# Patient Record
Sex: Male | Born: 2000 | Hispanic: Refuse to answer | Marital: Single | Smoking: Never smoker
Health system: Southern US, Community
[De-identification: ages and names within clinical notes are randomized; demographics above are authoritative.]

## PROBLEM LIST (undated history)

## (undated) DIAGNOSIS — G43909 Migraine, unspecified, not intractable, without status migrainosus: Secondary | ICD-10-CM

## (undated) HISTORY — DX: Migraine, unspecified, not intractable, without status migrainosus: G43.909

---
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
## 2019-01-12 ENCOUNTER — Other Ambulatory Visit: Payer: Self-pay

## 2019-01-12 ENCOUNTER — Telehealth (INDEPENDENT_AMBULATORY_CARE_PROVIDER_SITE_OTHER): Payer: PRIVATE HEALTH INSURANCE | Admitting: Family Medicine

## 2019-01-12 DIAGNOSIS — D573 Sickle-cell trait: Secondary | ICD-10-CM | POA: Diagnosis not present

## 2019-01-12 DIAGNOSIS — Z8619 Personal history of other infectious and parasitic diseases: Secondary | ICD-10-CM | POA: Diagnosis not present

## 2019-01-12 DIAGNOSIS — Z8616 Personal history of COVID-19: Secondary | ICD-10-CM

## 2019-01-13 NOTE — Progress Notes (Signed)
Patient denies any symptoms over the phone. Has returned to school after isolation. Presents for ECG after having COVID-19 diagnosed early in the month.  ECG compared to previous. No significant changes. Will have Cardiology review and compare with previous.  Advance activity slowly given hx of sickle cell trait and recent COVID-19. Discussed this with athletic trainer. Athletic trainer to discuss with Water engineer. Stay hydrated and inform training staff if any problems during physical activity.

## 2019-02-07 ENCOUNTER — Other Ambulatory Visit: Payer: Self-pay | Admitting: Family Medicine

## 2019-02-07 DIAGNOSIS — Z8616 Personal history of COVID-19: Secondary | ICD-10-CM

## 2019-02-07 DIAGNOSIS — Z8619 Personal history of other infectious and parasitic diseases: Secondary | ICD-10-CM

## 2019-02-09 ENCOUNTER — Other Ambulatory Visit: Payer: PRIVATE HEALTH INSURANCE

## 2019-02-12 ENCOUNTER — Ambulatory Visit (INDEPENDENT_AMBULATORY_CARE_PROVIDER_SITE_OTHER): Payer: No Typology Code available for payment source

## 2019-02-12 ENCOUNTER — Other Ambulatory Visit: Payer: Self-pay

## 2019-02-12 DIAGNOSIS — Z8616 Personal history of COVID-19: Secondary | ICD-10-CM

## 2019-02-12 DIAGNOSIS — Z8619 Personal history of other infectious and parasitic diseases: Secondary | ICD-10-CM | POA: Diagnosis not present

## 2019-06-01 IMAGING — CR DG SC JOINTS 3+V
1 series · 5 of 5 positions shown · non-contrast
Comparison: None available.

CLINICAL DATA: Initial evaluation for acute pain at right
sternoclavicular joint status post recent injury.

EXAM:
STERNOCLAVICULAR [id number]+ VIEW

[Series 1: dg sc joints · 0.14mm/px · 5 of 5 slices shown]
[im 1/5]
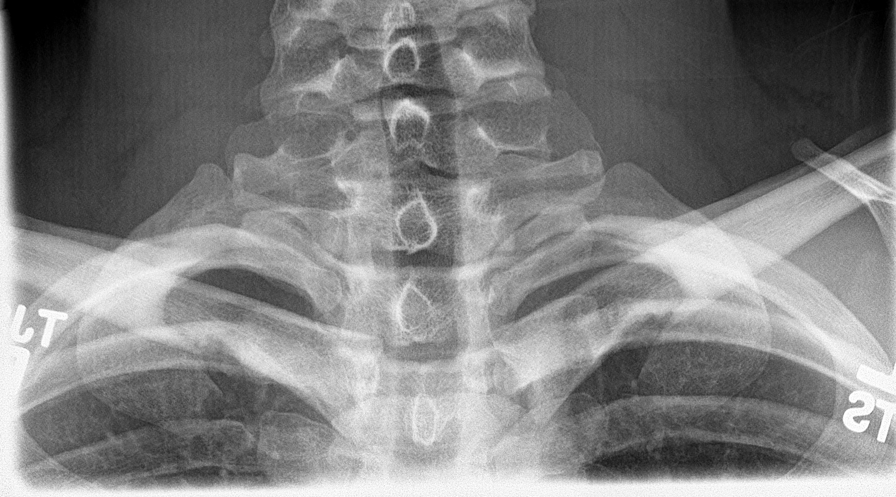
[im 2/5]
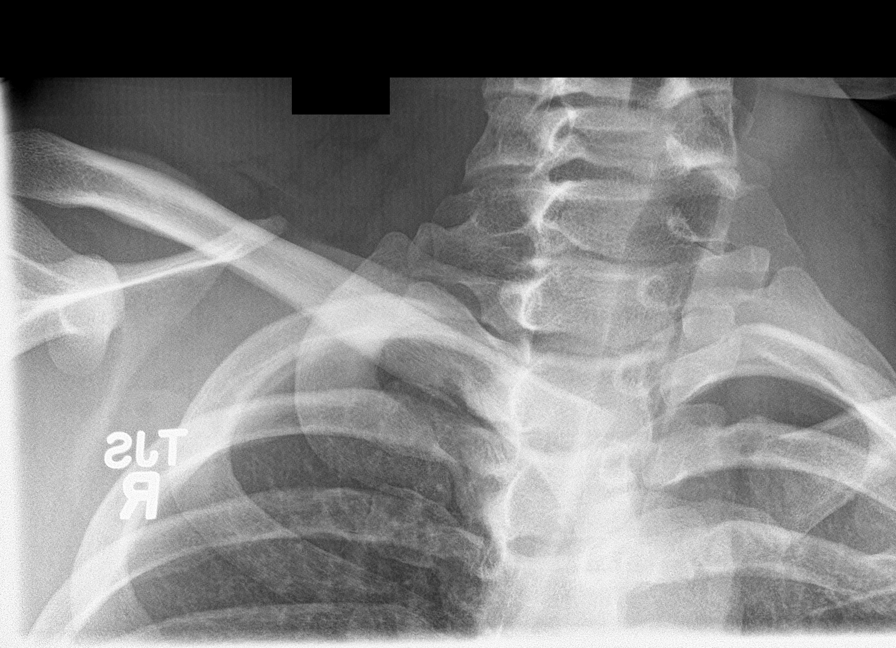
[im 3/5]
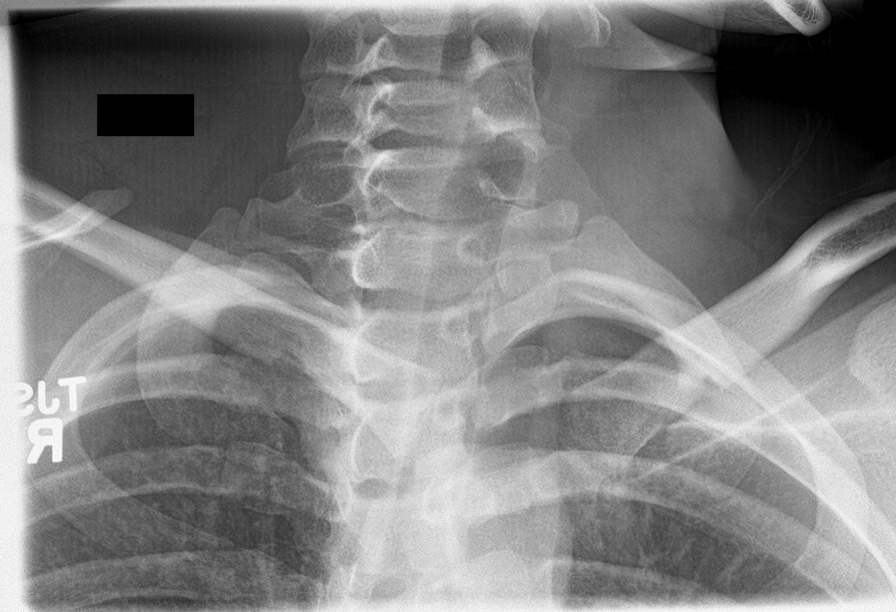
[im 4/5]
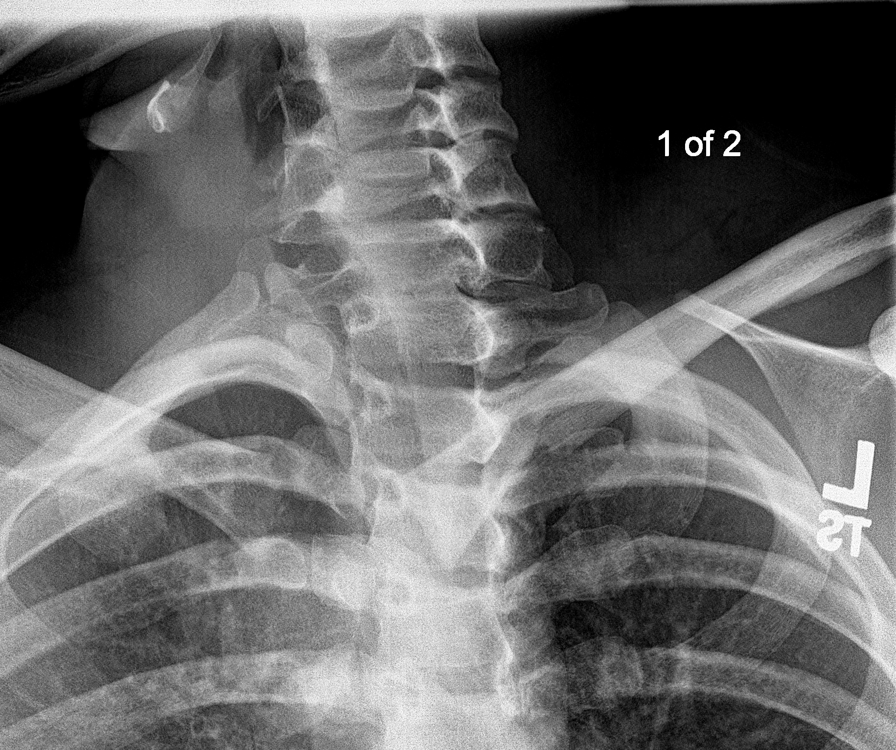
[im 5/5]
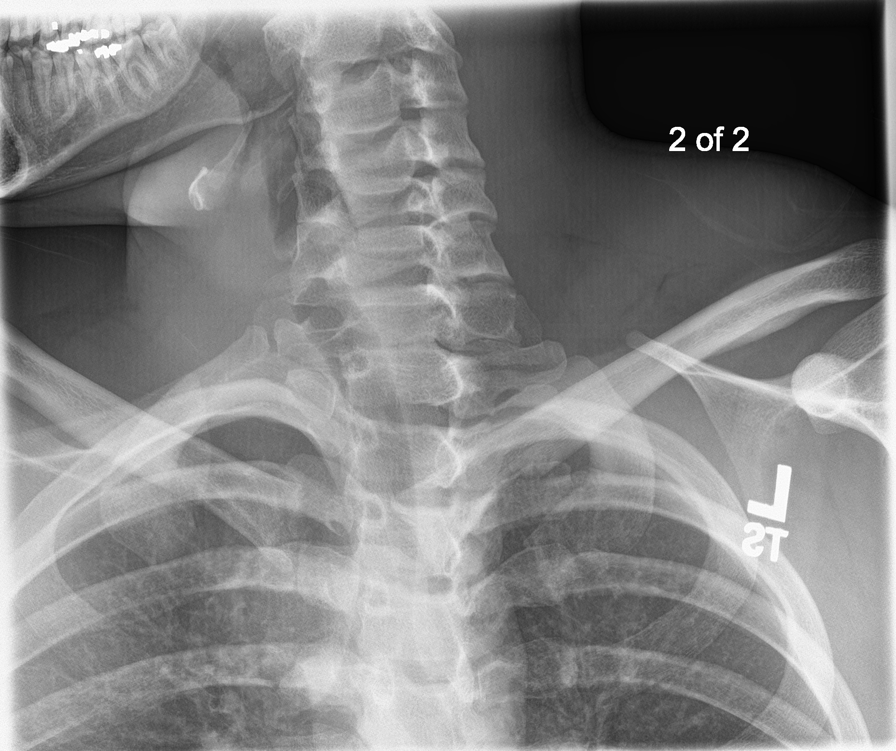

[5 of 5 positions shown; findings below may reference images not displayed]

FINDINGS: No acute displaced fracture identified about either clavicle or
sternoclavicular joint. Sternoclavicular joint some cells appear in
gross anatomic alignment. No focal osseous lesions. Visualized ribs
intact. Visualized upper lungs are clear. No soft tissue
abnormality.
IMPRESSION: No acute osseous abnormality identified about either
sternoclavicular joint.

## 2019-09-25 ENCOUNTER — Ambulatory Visit: Payer: No Typology Code available for payment source | Admitting: Family

## 2019-09-25 ENCOUNTER — Other Ambulatory Visit: Payer: Self-pay

## 2019-09-25 DIAGNOSIS — L089 Local infection of the skin and subcutaneous tissue, unspecified: Secondary | ICD-10-CM

## 2019-09-25 MED ORDER — SULFAMETHOXAZOLE-TRIMETHOPRIM 800-160 MG PO TABS: 1.0000 | ORAL_TABLET | Freq: Two times a day (BID) | ORAL | 0 refills | Status: DC

## 2019-09-25 NOTE — Progress Notes (Signed)
   Acute Office Visit  Subjective:    Patient ID: Hector Howard, male    DOB: Jul 07, 2000, 19 y.o.   MRN: [id number]  Chief Complaint  Patient presents with  . Wound Infection    HPI Patient is in today with c/o right toe infection.  He has a family history of ingrown toenails.  This pain started 1 week ago, worsening.  No otc tx.    No past medical history on file.   No family history on file.   No Known Allergies  Review of Systems  Constitutional: Positive for activity change.  Skin: Positive for wound.       Objective:    Physical Exam Constitutional:      Appearance: Normal appearance. He is normal weight.  Skin:    General: Skin is warm and dry.     Findings: Erythema present.     Comments: Redness and swelling to right medial great toe.  Tender to palpation.  No active drainage.  Neurological:     Mental Status: He is alert.     There were no vitals taken for this visit.     Assessment & Plan:   Problem List Items Addressed This Visit    None    Visit Diagnoses    Skin infection    -  Primary   Relevant Medications   sulfamethoxazole-trimethoprim (BACTRIM DS) 800-160 MG tablet     Reviewed exam and HPI.  Start Bactrim DS (sent to [facility]).  Keep area clean and dry.  Rtc if no improvement in 1 week or if symptoms return after treatment.   Meds ordered this encounter  Medications  . sulfamethoxazole-trimethoprim (BACTRIM DS) 800-160 MG tablet    Sig: Take 1 tablet by mouth 2 (two) times daily.    Dispense:  20 tablet    Refill:  0    Order Specific Question:   Supervising Provider    Answer:   Noralee Stain [[id number]]     Abbygayle Helfand, Deirdre Peer, NP

## 2019-10-01 ENCOUNTER — Other Ambulatory Visit: Payer: Self-pay

## 2019-10-01 ENCOUNTER — Ambulatory Visit: Payer: Medicaid Other | Admitting: Family

## 2019-10-01 DIAGNOSIS — Z113 Encounter for screening for infections with a predominantly sexual mode of transmission: Secondary | ICD-10-CM

## 2019-10-01 NOTE — Progress Notes (Signed)
   Acute Office Visit  Subjective:    Patient ID: Hector Howard, male    DOB: 2001-06-11, 19 y.o.   MRN: [id number]  Chief Complaint  Patient presents with  . Labs Only    STI testing    HPI Patient is in today for STI testing.  No history of STI testing. Pt denies symptoms or concerns.  Pt sexually active with females.  Does not use condoms at every encounter.  No past medical history on file.  No Known Allergies     Objective:    Physical Exam Constitutional:      Appearance: Normal appearance.     There were no vitals taken for this visit. Wt Readings from Last 3 Encounters:  No data found for Wt      Assessment & Plan:   Problem List Items Addressed This Visit    None    Visit Diagnoses    Screening examination for venereal disease    -  Primary     Reviewed CDC guidelines for STI screen. Will obtain GC, Trich, HIV and RPR.  Results will be emailed to pt. Practice safe sex.  No orders of the defined types were placed in this encounter.    Nimrat Woolworth, Deirdre Peer, NP

## 2019-10-02 ENCOUNTER — Other Ambulatory Visit: Payer: Self-pay | Admitting: Family

## 2019-10-05 LAB — HIV ANTIBODY (ROUTINE TESTING W REFLEX): HIV Screen 4th Generation wRfx: NONREACTIVE

## 2019-10-05 LAB — CHLAMYDIA/GONOCOCCUS/TRICHOMONAS, NAA
Chlamydia by NAA: POSITIVE — AB
Gonococcus by NAA: NEGATIVE
Trich vag by NAA: NEGATIVE

## 2019-10-05 LAB — RPR: RPR Ser Ql: NONREACTIVE

## 2019-10-06 ENCOUNTER — Telehealth: Payer: Self-pay | Admitting: Family

## 2019-10-06 DIAGNOSIS — A749 Chlamydial infection, unspecified: Secondary | ICD-10-CM

## 2019-10-06 MED ORDER — AZITHROMYCIN 500 MG PO TABS: 1000.0000 mg | ORAL_TABLET | Freq: Once | ORAL | 0 refills | Status: AC

## 2019-10-06 NOTE — Telephone Encounter (Signed)
Contacted pt with positive Chlamydia results.  Reviewed tx plan and to abstain from sex for 2 weeks, safe sex practices advised thereafter.  Follow up in 3 months for repeat testing.  Pt agrees with plan.  Azithromycin sent to [facility] in [place].  [facility] form sent.

## 2019-10-10 ENCOUNTER — Other Ambulatory Visit: Payer: Self-pay

## 2019-10-11 ENCOUNTER — Telehealth: Payer: Self-pay | Admitting: Family

## 2019-10-11 LAB — 5+CRT-BUND
Amphetamines, Urine: NEGATIVE ng/mL
Cannabinoid Quant, Ur: NEGATIVE ng/mL
Cocaine (Metab.): NEGATIVE ng/mL
Creatinine, Urine: 153.2 mg/dL (ref 20.0–300.0)
OPIATE QUANTITATIVE URINE: NEGATIVE ng/mL
PCP Quant, Ur: NEGATIVE ng/mL
pH, Urine: 6 (ref 4.5–8.9)

## 2019-10-11 NOTE — Telephone Encounter (Signed)
Contacted by the Athletic Department for a urine drug screen on this pt.  Order written 10/09/19, UDS obtained 10/10/19, resulted 10/11/19.  See scanned results.

## 2022-09-24 ENCOUNTER — Emergency Department
Admission: EM | Admit: 2022-09-24 | Discharge: 2022-09-25 | Disposition: A | Discharge status: Home / Self Care | Payer: Medicaid Other | Attending: Emergency Medicine | Admitting: Emergency Medicine

## 2022-09-24 ENCOUNTER — Other Ambulatory Visit: Payer: Self-pay

## 2022-09-24 DIAGNOSIS — M6282 Rhabdomyolysis: Secondary | ICD-10-CM | POA: Diagnosis not present

## 2022-09-24 DIAGNOSIS — R519 Headache, unspecified: Secondary | ICD-10-CM | POA: Diagnosis not present

## 2022-09-24 LAB — COMPREHENSIVE METABOLIC PANEL
ALT: 48 U/L — ABNORMAL HIGH (ref 0–44)
AST: 86 U/L — ABNORMAL HIGH (ref 15–41)
Albumin: 4.7 g/dL (ref 3.5–5.0)
Alkaline Phosphatase: 68 U/L (ref 38–126)
Anion gap: 12 (ref 5–15)
BUN: 12 mg/dL (ref 6–20)
CO2: 25 mmol/L (ref 22–32)
Calcium: 9.6 mg/dL (ref 8.9–10.3)
Chloride: 99 mmol/L (ref 98–111)
Creatinine, Ser: 1.23 mg/dL (ref 0.61–1.24)
GFR, Estimated: >60 mL/min (ref >60)
Glucose, Bld: 99 mg/dL (ref 70–99)
Potassium: 4 mmol/L (ref 3.5–5.1)
Sodium: 136 mmol/L (ref 135–145)
Total Bilirubin: 0.8 mg/dL (ref 0.3–1.2)
Total Protein: 7.7 g/dL (ref 6.5–8.1)

## 2022-09-24 LAB — CBC
HCT: 44.4 % (ref 39.0–52.0)
Hemoglobin: 15.3 g/dL (ref 13.0–17.0)
MCH: 27.6 pg (ref 26.0–34.0)
MCHC: 34.5 g/dL (ref 30.0–36.0)
MCV: 80 fL (ref 80.0–100.0)
Platelets: 279 10*3/uL (ref 150–400)
RBC: 5.55 MIL/uL (ref 4.22–5.81)
RDW: 12.3 % (ref 11.5–15.5)
WBC: 8.6 10*3/uL (ref 4.0–10.5)
nRBC: 0 % (ref 0.0–0.2)

## 2022-09-24 LAB — LIPASE, BLOOD: Lipase: 27 U/L (ref 11–51)

## 2022-09-24 NOTE — ED Triage Notes (Signed)
Pt to ED via POV c/o vomiting, blurry vision, and headache. Pt states he has been vomiting for the past 3 hrs. Pt also says he's having blurry vision and headache, which he usually gets with migraines but this feels different. Pt denies CP, SOB, fevers, dizziness

## 2022-09-25 ENCOUNTER — Emergency Department: Payer: Medicaid Other

## 2022-09-25 LAB — CK: Total CK: 3690 U/L — ABNORMAL HIGH (ref 49–397)

## 2022-09-25 MED ORDER — BUTALBITAL-APAP-CAFFEINE 50-325-40 MG PO TABS: 1.0000 | ORAL_TABLET | ORAL | 0 refills | Status: AC | PRN

## 2022-09-25 MED ORDER — DEXAMETHASONE SODIUM PHOSPHATE 10 MG/ML IJ SOLN
10.0000 mg | Freq: Once | INTRAMUSCULAR | Status: AC
  Administered 2022-09-25: 10 mg via INTRAVENOUS
  Filled 2022-09-25: qty 1

## 2022-09-25 MED ORDER — METOCLOPRAMIDE HCL 5 MG/ML IJ SOLN
5.0000 mg | Freq: Once | INTRAMUSCULAR | Status: AC
  Administered 2022-09-25: 5 mg via INTRAVENOUS
  Filled 2022-09-25: qty 2

## 2022-09-25 MED ORDER — SODIUM CHLORIDE 0.9 % IV BOLUS
1000.0000 mL | Freq: Once | INTRAVENOUS | Status: AC
  Administered 2022-09-25: 1000 mL via INTRAVENOUS

## 2022-09-25 MED ORDER — IOHEXOL 350 MG/ML SOLN
75.0000 mL | Freq: Once | INTRAVENOUS | Status: AC | PRN
  Administered 2022-09-25: 75 mL via INTRAVENOUS

## 2022-09-25 NOTE — ED Provider Notes (Signed)
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  Patient states he has never been formally diagnosed with migraine headaches but has left-sided headaches approximately twice a year.  Last headache was 7 months ago after a football game.  Tonight he was watching football when he noted left-sided headache which she describes as tightness approximately 6 hours ago.  Symptoms associated with blurry vision, nausea and vomiting.  Similar symptoms previously but states tonight is the longest and most intense that his headache has lasted.  No relief with ibuprofen, caffeine and fluids given by his trainer.  Denies recent fever/chills, cough, chest pain, shortness of breath, abdominal pain, diarrhea or dizziness.  Denies recent trauma.     Past Medical History  History reviewed. No pertinent past medical history.   Active Problem List  There are no problems to display for this patient.    Past Surgical History   Past Surgical History:  Procedure Laterality Date   KIDNEY SURGERY Right 2016     Home Medications   Prior to Admission medications   Medication Sig Start Date End Date Taking? Authorizing Provider  butalbital-acetaminophen-caffeine (FIORICET) 50-325-40 MG tablet Take 1 tablet by mouth every 4 (four) hours as needed for headache. 09/25/22  Yes Irean Hong, MD  sulfamethoxazole-trimethoprim (BACTRIM DS) 800-160 MG tablet Take 1 tablet by mouth 2 (two) times daily. 09/25/19   Ronne Binning, NP     Allergies  Patient has no known allergies.   Family History  History reviewed. No pertinent family history.   Physical Exam  Triage Vital Signs: ED Triage Vitals  Enc Vitals Group     BP 09/24/22 2131  139/71     Pulse Rate 09/24/22 2131 67     Resp 09/24/22 2131 16     Temp 09/24/22 2131 97.7 F (36.5 C)     Temp Source 09/24/22 2131 Oral     SpO2 09/24/22 2131 100 %     Weight 09/24/22 2129 200 lb (90.7 kg)     Height 09/24/22 2129 6\' 1"  (1.854 m)     Head Circumference --      Peak Flow --      Pain Score 09/24/22 2129 3     Pain Loc --      Pain Edu? --      Excl. in GC? --     Updated Vital Signs: BP 119/77   Pulse (!) 59   Temp 97.7 F (36.5 C) (Oral)   Resp 16   Ht 6\' 1"  (1.854 m)   Wt 90.7 kg   SpO2 99%   BMI 26.39 kg/m    General: Awake, mild distress.  CV:  RRR.  Good peripheral perfusion.  Resp:  Normal effort.  CTAB. Abd:  Nontender.  No distention.  Other:  Alert and oriented x 3.  PERRL.  EOMI.  No carotid bruits.  Supple neck without meningismus.  CN II-XII grossly intact.  5/5 motor strength and sensation all extremities.   MAE x 4.  No petechiae.   ED Results / Procedures / Treatments  Labs (all labs ordered are listed, but only abnormal results are displayed) Labs Reviewed  COMPREHENSIVE METABOLIC PANEL - Abnormal; Notable for the following components:      Result Value   AST 86 (*)    ALT 48 (*)    All other components within normal limits  CK - Abnormal; Notable for the following components:   Total CK 3,690 (*)    All other components within normal limits  LIPASE, BLOOD  CBC  URINALYSIS, ROUTINE W REFLEX MICROSCOPIC     EKG  None   RADIOLOGY I have independently visualized and interpreted patient's CTA head as well as noted the radiology interpretation:  CTA head: Unremarkable; no acute intracranial abnormality, no intracranial large vessel occlusion or significant stenosis.  No evidence of aneurysm or vascular malformation.  Official radiology report(s): CT Angio Head W or Wo Contrast  Result Date: 09/25/2022 CLINICAL DATA:  Left headache, worse than usual, vomiting, blurry vision EXAM: CT ANGIOGRAPHY HEAD TECHNIQUE:  Multidetector CT imaging of the head was performed using the standard protocol during bolus administration of intravenous contrast. Multiplanar CT image reconstructions and MIPs were obtained to evaluate the vascular anatomy. RADIATION DOSE REDUCTION: This exam was performed according to the departmental dose-optimization program which includes automated exposure control, adjustment of the mA and/or kV according to patient size and/or use of iterative reconstruction technique. CONTRAST:  35mL OMNIPAQUE IOHEXOL 350 MG/ML SOLN COMPARISON:  None Available. FINDINGS: CT HEAD FINDINGS Brain: No evidence of acute infarct, hemorrhage, mass, mass effect, or midline shift. No hydrocephalus or extra-axial fluid collection. Vascular: No hyperdense vessel. Skull: Negative for fracture or focal lesion. Sinuses/Orbits: No acute finding. Other: The mastoid air cells are well aerated. CTA HEAD FINDINGS Anterior circulation: Both internal carotid arteries are patent to the termini, without significant stenosis. A1 segments patent. Normal anterior communicating artery. Anterior cerebral arteries are patent to their distal aspects without significant stenosis. No M1 stenosis or occlusion. MCA branches perfused to their distal aspects without significant stenosis. Posterior circulation: Vertebral arteries patent to the vertebrobasilar junction without significant stenosis. Posterior inferior cerebellar artery patent on the left, not seen on the right. Basilar patent to its distal aspect without significant stenosis. Superior cerebellar arteries patent proximally. Patent P1 segments. PCAs perfused to their distal aspects without significant stenosis. The bilateral posterior communicating arteries are patent. Venous sinuses: Patent. Anatomic variants: None significant. Review of the MIP images confirms the above findings No evidence of aneurysm or vascular malformation. IMPRESSION: 1. No acute intracranial abnormality. 2. No intracranial  large vessel occlusion or significant stenosis. No evidence of aneurysm or vascular malformation. Electronically Signed   By: Wiliam Ke M.D.   On: 09/25/2022 01:02     PROCEDURES:  Critical Care performed: No  Procedures   MEDICATIONS ORDERED IN ED: Medications  sodium chloride 0.9 % bolus 1,000 mL (0 mLs Intravenous Stopped 09/25/22 0348)  metoCLOPramide (REGLAN) injection 5 mg (5 mg Intravenous Given 09/25/22 0024)  dexamethasone (DECADRON) injection 10 mg (10 mg Intravenous Given 09/25/22 0026)  iohexol (OMNIPAQUE) 350 MG/ML injection 75 mL (75 mLs Intravenous Contrast Given 09/25/22 0041)  sodium chloride 0.9 % bolus 1,000 mL (0 mLs Intravenous Stopped 09/25/22 0348)     IMPRESSION / MDM / ASSESSMENT AND PLAN / ED COURSE  I reviewed the triage vital signs and the nursing notes.                             22 year old male presenting with headache. Differential diagnosis includes, but is not limited to, intracranial hemorrhage,  meningitis/encephalitis, previous head trauma, cavernous venous thrombosis, tension headache, temporal arteritis, migraine or migraine equivalent, idiopathic intracranial hypertension, and non-specific headache.  I have personally reviewed patient's records and note an urgent care visit on 04/05/2022 for vaccination.  Patient's presentation is most consistent with acute presentation with potential threat to life or bodily function.  Laboratory results demonstrate normal WBC 8.6, unremarkable electrolytes.  Neck is supple and patient is neurologically intact without deficits.  However, given complaints this is the longest lasting headache he has ever had, will obtain CTA head with and without contrast.  Initiate IV fluid hydration, IV Reglan and Decadron.  Will reassess.  Clinical Course as of 09/25/22 0542  Sat Sep 25, 2022  0216 Updated patient and athletic trainer of negative CTA head.  CK is 3690.  Will discharge home after infusion of 2 L IV fluids.  Will  refer to neurology for outpatient follow-up and discharged home on as needed Fioricet.  Strict return precautions given.  Patient verbalizes understanding and agrees with plan of care. [JS]    Clinical Course User Index [JS] Irean HongSung, Jacqulyn Barresi J, MD     FINAL CLINICAL IMPRESSION(S) / ED DIAGNOSES   Final diagnoses:  Bad headache  Non-traumatic rhabdomyolysis     Rx / DC Orders   ED Discharge Orders          Ordered    butalbital-acetaminophen-caffeine (FIORICET) 50-325-40 MG tablet  Every 4 hours PRN        09/25/22 0218             Note:  This document was prepared using Dragon voice recognition software and may include unintentional dictation errors.   Irean HongSung, Jalonda Antigua J, MD 09/25/22 [phone]

## 2022-09-25 NOTE — Discharge Instructions (Signed)
You may take Fioricet as needed for headache.  Drink plenty of fluids daily.  Return to the ER for worsening symptoms, persistent vomiting, difficulty breathing or other concerns.

## 2022-10-19 ENCOUNTER — Other Ambulatory Visit: Payer: Self-pay

## 2022-10-19 ENCOUNTER — Ambulatory Visit (INDEPENDENT_AMBULATORY_CARE_PROVIDER_SITE_OTHER): Payer: Medicaid Other | Admitting: Medical

## 2022-10-19 ENCOUNTER — Encounter: Payer: Self-pay | Admitting: Medical

## 2022-10-19 VITALS — BP 126/81 | HR 78 | Temp 97.9°F | Wt 199.0 lb

## 2022-10-19 DIAGNOSIS — J22 Unspecified acute lower respiratory infection: Secondary | ICD-10-CM

## 2022-10-19 MED ORDER — AMOXICILLIN-POT CLAVULANATE 875-125 MG PO TABS
1.0000 | ORAL_TABLET | Freq: Two times a day (BID) | ORAL | 0 refills | Status: DC
Start: 2022-10-19 — End: 2022-12-08

## 2022-10-19 NOTE — Patient Instructions (Signed)
-  Take complete course of antibiotics as prescribed.  Take with food.   -Rest and stay well hydrated (by drinking water and other liquids).  -Take over-the-counter medicines (i.e. Mucinex DM, Sudafed) to help relieve your symptoms. -For your cough, use cough drops/throat lozenges, gargle warm salt water and/or drink warm liquids (like tea with honey). -Send MyChart message to provider or schedule return visit as needed for new/worsening symptoms (i.e. fever, shortness of breath) or if symptoms do not improve as discussed with recommended treatment over next 3-5 days.

## 2022-10-19 NOTE — Progress Notes (Signed)
[facility] [address] [place], [place] [address] Phone: [phone] Fax: [phone]   Office Visit Note  Patient Name: Hector Howard  Date of Birth:[birth year]  Med Rec number [id number]  Date of Service: 10/19/2022  Allergies: Patient has no known allergies.  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  Cough began about 2.5 weeks ago, was accompanied by nasal congestion. No sore throat. No fever or chills. Cough worsened some about 10 days ago. Cough has been productive of yellow mucus. Has similar nasal discharge. Feels vibration in chest sometimes when he breathes. No shortness of breath. Does not smoke or vape. Denies difficulty sleeping from cough. Getting about 7 hrs sleep at night. Does not feel cough is getting better. No chest pain. Little more fatigued than usual.  Took Cetirizine for about 10 days, did not seem to help. Denies hx of allergies or asthma. No other meds tried.   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   Current Medication:  Outpatient Encounter Medications as of 10/19/2022  Medication Sig   rizatriptan (MAXALT-MLT) 10 MG disintegrating tablet Take 1 tab at headache onset can repeat once in 2 hours if needed.  No more than 2 doses in 24 hours   butalbital-acetaminophen-caffeine (FIORICET) 50-325-40 MG tablet Take 1 tablet by mouth every 4 (four) hours as needed for headache.   [DISCONTINUED] sulfamethoxazole-trimethoprim (BACTRIM DS) 800-160 MG tablet Take 1 tablet by mouth 2 (two) times daily. (Patient not taking: Reported on 10/19/2022)   No facility-administered encounter medications on file as of 10/19/2022.      Medical History: Past Medical History:  Diagnosis Date   Migraine      Vital Signs: BP 126/81   Pulse 78   Temp 97.9 F (36.6 C) (Tympanic)   Wt 199 lb (90.3 kg)   SpO2 97%   BMI 26.25 kg/m    Review of Systems See  HPI  Physical Exam Vitals reviewed.  Constitutional:      General: He is not in acute distress.    Appearance: He is not ill-appearing.     Comments: Tired appearing  HENT:     Head: Normocephalic.     Right Ear: Ear canal and external ear normal.     Left Ear: Ear canal and external ear normal.     Ears:     Comments: TMs slightly dull bilaterally    Nose: Mucosal edema and congestion present. No rhinorrhea.     Right Turbinates: Swollen.     Left Turbinates: Swollen.     Comments: Some pressure but not pain with palpation over maxillary sinuses.    Mouth/Throat:     Mouth: Mucous membranes are moist. No oral lesions.     Pharynx: No pharyngeal swelling or posterior oropharyngeal erythema.     Tonsils: No tonsillar exudate. 0 on the right. 0 on the left.     Comments: Small thick yellow post nasal drainage. Cardiovascular:     Rate and Rhythm: Normal rate and regular rhythm.     Heart sounds: No murmur heard.    No friction rub. No gallop.  Pulmonary:     Effort: Pulmonary effort is normal. No respiratory distress.     Breath sounds: Examination of the right-lower field reveals wheezing. Examination of the left-lower field reveals decreased breath sounds, wheezing and rales. Decreased breath sounds, wheezing and rales present. No  rhonchi.  Musculoskeletal:     Cervical back: Neck supple. No rigidity.  Lymphadenopathy:     Cervical: Cervical adenopathy (1+ anterior nodes, nontender) present.  Neurological:     Mental Status: He is alert.      Assessment/Plan: 1. Lower respiratory infection Given duration of symptoms and findings on exam, will treat with Augmentin. Also recommended Mucinex DM and Sudafed for symptomatic relief. Patient declined prescription for albuterol inhaler. Patient advised to send MyChart message to provider or schedule return visit as needed for new/worsening symptoms (i.e. shortness of breath, fever) or if symptoms do not improve as discussed with  antibiotic over next 3-5 days.  - amoxicillin-clavulanate (AUGMENTIN) 875-125 MG tablet; Take 1 tablet by mouth 2 (two) times daily.  Dispense: 20 tablet; Refill: 0     General Counseling: Jens verbalizes understanding of the findings of todays visit and agrees with plan of treatment. he has been encouraged to call the office with any questions or concerns that should arise related to todays visit.   Meds ordered this encounter  Medications   amoxicillin-clavulanate (AUGMENTIN) 875-125 MG tablet    Sig: Take 1 tablet by mouth 2 (two) times daily.    Dispense:  20 tablet    Refill:  0    Order Specific Question:   Supervising Provider    Answer:   Noralee Stain [[id number]]    Time spent:20 Minutes    Jonathon Resides PA-C [facility] 10/19/2022 3:28 PM

## 2022-12-08 ENCOUNTER — Other Ambulatory Visit: Payer: Self-pay

## 2022-12-08 ENCOUNTER — Ambulatory Visit (INDEPENDENT_AMBULATORY_CARE_PROVIDER_SITE_OTHER): Payer: Medicaid Other | Admitting: Adult Health

## 2022-12-08 ENCOUNTER — Encounter: Payer: Self-pay | Admitting: Adult Health

## 2022-12-08 VITALS — BP 106/67 | HR 69 | Temp 98.6°F

## 2022-12-08 DIAGNOSIS — Z202 Contact with and (suspected) exposure to infections with a predominantly sexual mode of transmission: Secondary | ICD-10-CM

## 2022-12-08 MED ORDER — DOXYCYCLINE HYCLATE 100 MG PO TABS
100.0000 mg | ORAL_TABLET | Freq: Two times a day (BID) | ORAL | 0 refills | Status: AC
Start: 2022-12-08 — End: ?

## 2022-12-08 NOTE — Progress Notes (Signed)
[facility] [address] [place] [place], [place] [address] Phone: [phone] Fax: [phone]   Office Visit Note  Patient Name: Hector Howard  Date of Birth:Nov 13, 2000  Med Rec number [id number]  Date of Service: 12/08/2022  Patient has no known allergies.  Chief Complaint  Patient presents with   std testing     HPI Patient is here reporting that a sexual partner alerted him that she tested positive for chlamydia yesterday. He has not been tested in about a year. He reports two male partners in the last year since last testing.  He is not using condoms. He participates in oral and vaginal sex. He does not have any symptoms.    Current Medication:  Outpatient Encounter Medications as of 12/08/2022  Medication Sig   doxycycline (VIBRA-TABS) 100 MG tablet Take 1 tablet (100 mg total) by mouth 2 (two) times daily.   butalbital-acetaminophen-caffeine (FIORICET) 50-325-40 MG tablet Take 1 tablet by mouth every 4 (four) hours as needed for headache.   rizatriptan (MAXALT-MLT) 10 MG disintegrating tablet Take 1 tab at headache onset can repeat once in 2 hours if needed.  No more than 2 doses in 24 hours   [DISCONTINUED] amoxicillin-clavulanate (AUGMENTIN) 875-125 MG tablet Take 1 tablet by mouth 2 (two) times daily. (Patient not taking: Reported on 12/08/2022)   No facility-administered encounter medications on file as of 12/08/2022.      Medical History: Past Medical History:  Diagnosis Date   Migraine      Vital Signs: BP 106/67   Pulse 69   Temp 98.6 F (37 C) (Tympanic)   SpO2 99%    Review of Systems  Constitutional:  Negative for chills, fatigue and fever.  Genitourinary:  Negative for difficulty urinating, frequency, penile swelling, scrotal swelling and testicular pain.    Physical Exam Vitals and nursing note reviewed.  Constitutional:      Appearance: Normal appearance.  Neurological:     Mental Status: He is alert.    Assessment/Plan: 1.  Exposure to chlamydia Discussed exposure to Chlamydia.  Pt will take Doxycycline for 7 days as prescribed.  No Oral, Anal or Vaginal sex for two weeks total.  Pt verbalized understanding. Follow up as needed.  I do recommend that he be tested in a few weeks for his routine testing, since its been a year.  - doxycycline (VIBRA-TABS) 100 MG tablet; Take 1 tablet (100 mg total) by mouth 2 (two) times daily.  Dispense: 14 tablet; Refill: 0     General Counseling: Hector Howard verbalizes understanding of the findings of todays visit and agrees with plan of treatment. I have discussed any further diagnostic evaluation that may be needed or ordered today. We also reviewed his medications today. he has been encouraged to call the office with any questions or concerns that should arise related to todays visit.   No orders of the defined types were placed in this encounter.   Meds ordered this encounter  Medications   doxycycline (VIBRA-TABS) 100 MG tablet    Sig: Take 1 tablet (100 mg total) by mouth 2 (two) times daily.    Dispense:  14 tablet    Refill:  0    Time spent:15 Minutes Time spent includes review of chart, medications, test results, and follow up plan with the patient.    Hector Howard AGNP-C Nurse Practitioner

## 2023-02-16 ENCOUNTER — Ambulatory Visit (INDEPENDENT_AMBULATORY_CARE_PROVIDER_SITE_OTHER): Payer: Medicaid Other | Admitting: Adult Health

## 2023-02-16 ENCOUNTER — Encounter: Payer: Self-pay | Admitting: Adult Health

## 2023-02-16 VITALS — BP 124/80 | HR 83 | Temp 98.2°F | Ht 73.0 in | Wt 200.0 lb

## 2023-02-16 DIAGNOSIS — Z111 Encounter for screening for respiratory tuberculosis: Secondary | ICD-10-CM

## 2023-02-16 DIAGNOSIS — Z113 Encounter for screening for infections with a predominantly sexual mode of transmission: Secondary | ICD-10-CM

## 2023-02-16 NOTE — Progress Notes (Signed)
[facility] [address] [place], [place] [address] Phone: [phone] Fax: [phone]   Office Visit Note  Patient Name: Hector Howard  Date of Birth:2001-04-26  Med Rec number [id number]  Date of Service: 02/16/2023  Patient has no known allergies.  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  He also needs STI screen, he is requesting Urine for Chlymadia and Gonorrhea.  Denies any known exposures.  He has had two male partners since June, and they did not use condoms. Denies any symptoms.    Current Medication:  Outpatient Encounter Medications as of 02/16/2023  Medication Sig   butalbital-acetaminophen-caffeine (FIORICET) 50-325-40 MG tablet Take 1 tablet by mouth every 4 (four) hours as needed for headache.   doxycycline (VIBRA-TABS) 100 MG tablet Take 1 tablet (100 mg total) by mouth 2 (two) times daily. (Patient not taking: Reported on 02/16/2023)   rizatriptan (MAXALT-MLT) 10 MG disintegrating tablet Take 1 tab at headache onset can repeat once in 2 hours if needed.  No more than 2 doses in 24 hours   No facility-administered encounter medications on file as of 02/16/2023.      Medical History: Past Medical History:  Diagnosis Date   Migraine      Vital Signs: BP 124/80   Pulse 83   Temp 98.2 F (36.8 C) (Tympanic)   Ht 6\' 1"  (1.854 m)   Wt 200 lb (90.7 kg)   SpO2 96%   BMI 26.39 kg/m    Review of Systems  Constitutional:  Negative for chills, fatigue and fever.  Genitourinary:  Negative for difficulty urinating, dysuria, penile discharge, penile pain and testicular pain.    Physical Exam Vitals reviewed.  Constitutional:      Appearance: Normal appearance.  Genitourinary:    Comments: Deferred, asymptomatic, routine screening. Neurological:     Mental Status: He is alert.    Assessment/Plan: 1. Screening examination for pulmonary  tuberculosis Results in mychart when available.  - QuantiFERON-TB Gold Plus  2. Screening examination for venereal disease Discussed available testing options.  Patient elected for Gonorrhea, Chlamydia testing today.  Patient will be contacted via MyChart when labs results are available.  Encouraged consistent condom use, as well as regular testing (every 3-6 months) when changing sex partners, or when having multiple partners.    - GC/Chlamydia Probe Amp     General Counseling: Hector Howard verbalizes understanding of the findings of todays visit and agrees with plan of treatment. I have discussed any further diagnostic evaluation that may be needed or ordered today. We also reviewed his medications today. he has been encouraged to call the office with any questions or concerns that should arise related to todays visit.   Orders Placed This Encounter  Procedures   GC/Chlamydia Probe Amp   QuantiFERON-TB Gold Plus    No orders of the defined types were placed in this encounter.   Time spent:15 Minutes Time spent includes review of chart, medications, test results, and follow up plan with the patient.    Johnna Acosta AGNP-C Nurse Practitioner

## 2023-02-18 LAB — GC/CHLAMYDIA PROBE AMP
Chlamydia trachomatis, NAA: NEGATIVE
Neisseria Gonorrhoeae by PCR: NEGATIVE

## 2023-02-18 LAB — QUANTIFERON-TB GOLD PLUS
QuantiFERON Mitogen Value: >10 [IU]/mL
QuantiFERON Nil Value: 0.23 [IU]/mL
QuantiFERON TB1 Ag Value: 0.23 [IU]/mL
QuantiFERON TB2 Ag Value: 0.21 [IU]/mL
QuantiFERON-TB Gold Plus: NEGATIVE

## 2023-02-21 ENCOUNTER — Telehealth: Payer: Self-pay

## 2023-02-21 NOTE — Telephone Encounter (Signed)
Called and left a message for patient to return call. Per Madelaine Bhat recent labs are negative. Will await CB.

## 2023-02-21 NOTE — Telephone Encounter (Signed)
Patient returned CB-Informed patient per Madelaine Bhat that all recent labs were normal. Patient verbalized understanding on all.
